# Patient Record
Sex: Female | Born: 1965 | Race: White | Hispanic: No | Marital: Married | Smoking: Never smoker
Health system: Southern US, Community
[De-identification: ages and names within clinical notes are randomized; demographics above are authoritative.]

## PROBLEM LIST (undated history)

## (undated) DIAGNOSIS — T7840XA Allergy, unspecified, initial encounter: Secondary | ICD-10-CM

## (undated) DIAGNOSIS — S5290XA Unspecified fracture of unspecified forearm, initial encounter for closed fracture: Secondary | ICD-10-CM

## (undated) DIAGNOSIS — Z8619 Personal history of other infectious and parasitic diseases: Secondary | ICD-10-CM

## (undated) HISTORY — DX: Allergy, unspecified, initial encounter: T78.40XA

## (undated) HISTORY — DX: Unspecified fracture of unspecified forearm, initial encounter for closed fracture: S52.90XA

## (undated) HISTORY — DX: Personal history of other infectious and parasitic diseases: Z86.19

---

## 1998-05-17 ENCOUNTER — Inpatient Hospital Stay (HOSPITAL_COMMUNITY): Admission: AD | Admit: 1998-05-17 | Discharge: 1998-05-21 | Payer: Self-pay | Admitting: *Deleted

## 1998-05-22 ENCOUNTER — Encounter (HOSPITAL_COMMUNITY): Admission: RE | Admit: 1998-05-22 | Discharge: 1998-08-20 | Payer: Self-pay | Admitting: *Deleted

## 2000-11-27 ENCOUNTER — Other Ambulatory Visit: Admission: RE | Admit: 2000-11-27 | Discharge: 2000-11-27 | Payer: Self-pay | Admitting: Obstetrics and Gynecology

## 2001-11-18 ENCOUNTER — Other Ambulatory Visit: Admission: RE | Admit: 2001-11-18 | Discharge: 2001-11-18 | Payer: Self-pay | Admitting: Obstetrics and Gynecology

## 2002-06-16 ENCOUNTER — Inpatient Hospital Stay (HOSPITAL_COMMUNITY): Admission: AD | Admit: 2002-06-16 | Discharge: 2002-06-18 | Payer: Self-pay | Admitting: Obstetrics and Gynecology

## 2002-08-11 ENCOUNTER — Other Ambulatory Visit: Admission: RE | Admit: 2002-08-11 | Discharge: 2002-08-11 | Payer: Self-pay | Admitting: Obstetrics and Gynecology

## 2015-08-26 ENCOUNTER — Encounter (HOSPITAL_COMMUNITY): Payer: Self-pay | Admitting: Emergency Medicine

## 2015-08-26 ENCOUNTER — Emergency Department (HOSPITAL_COMMUNITY)
Admission: EM | Admit: 2015-08-26 | Discharge: 2015-08-26 | Disposition: A | Discharge status: Home / Self Care | Payer: BLUE CROSS/BLUE SHIELD | Attending: Emergency Medicine | Admitting: Emergency Medicine

## 2015-08-26 ENCOUNTER — Emergency Department (HOSPITAL_COMMUNITY): Payer: BLUE CROSS/BLUE SHIELD

## 2015-08-26 DIAGNOSIS — S6991XA Unspecified injury of right wrist, hand and finger(s), initial encounter: Secondary | ICD-10-CM | POA: Diagnosis present

## 2015-08-26 DIAGNOSIS — W268XXA Contact with other sharp object(s), not elsewhere classified, initial encounter: Secondary | ICD-10-CM | POA: Insufficient documentation

## 2015-08-26 DIAGNOSIS — S61219A Laceration without foreign body of unspecified finger without damage to nail, initial encounter: Secondary | ICD-10-CM

## 2015-08-26 DIAGNOSIS — S61210A Laceration without foreign body of right index finger without damage to nail, initial encounter: Secondary | ICD-10-CM | POA: Insufficient documentation

## 2015-08-26 DIAGNOSIS — Y9389 Activity, other specified: Secondary | ICD-10-CM | POA: Diagnosis not present

## 2015-08-26 DIAGNOSIS — Y999 Unspecified external cause status: Secondary | ICD-10-CM | POA: Insufficient documentation

## 2015-08-26 DIAGNOSIS — Y929 Unspecified place or not applicable: Secondary | ICD-10-CM | POA: Insufficient documentation

## 2015-08-26 NOTE — ED Notes (Signed)
Dermabond given to PA. 

## 2015-08-26 NOTE — ED Triage Notes (Signed)
Pt states she cut her right index finger while washing a hand blender  Pt states she thought it was unplugged but it was not  Pt has a laceration noted to the tip of the finger  Bleeding not controlled upon arrival

## 2015-08-26 NOTE — ED Notes (Signed)
Bulky dressing changed at this time. Pt assisted to elevate finger.

## 2015-08-26 NOTE — ED Provider Notes (Signed)
[facility]-EMERGENCY DEPT Provider Note   CSN: [id number] Arrival date & time: 08/26/15  2145  First Provider Contact:  None       History   Chief Complaint Chief Complaint  Patient presents with  . Laceration    HPI Sheila Odonnell is a 50 y.o. female with no significant past medical history who presents to the emergency department today complaining of a laceration to her right index finger. Patient states that she was cleaning her blender and felt that it was unplugged. However, when she turned it on to wash it it sliced the tip of her right finger. She was unable to control the bleeding. She did wash with peroxide and water. She had her last tetanus shot a few months ago.   HPI  History reviewed. No pertinent past medical history.  There are no active problems to display for this patient.   Past Surgical History:  Procedure Laterality Date  . CESAREAN SECTION      OB History    No data available       Home Medications    Prior to Admission medications   Not on File    Family History Family History  Problem Relation Age of Onset  . Hypertension Father     Social History Social History  Substance Use Topics  . Smoking status: Never Smoker  . Smokeless tobacco: Never Used  . Alcohol use Yes     Comment: social      Allergies   Amoxicillin   Review of Systems Review of Systems  All other systems reviewed and are negative.    Physical Exam Updated Vital Signs BP 140/84 (BP Location: Left Arm)   Pulse 70   Temp 98.4 F (36.9 C) (Oral)   Resp 15   Ht 5\' 2"  (1.575 m)   Wt 70.3 kg   SpO2 96%   BMI 28.35 kg/m   Physical Exam  Constitutional: She is oriented to person, place, and time. She appears well-developed and well-nourished. No distress.  HENT:  Head: Normocephalic and atraumatic.  Eyes: Conjunctivae are normal. Right eye exhibits no discharge. Left eye exhibits no discharge. No scleral icterus.  Cardiovascular: Normal rate.     Pulmonary/Chest: Effort normal.  Musculoskeletal:  FROM of digits. No evidence of tendon injury  Neurological: She is alert and oriented to person, place, and time. Coordination normal.  Normal sensation  Skin: Skin is warm and dry. No rash noted. She is not diaphoretic. No erythema. No pallor.  0.5 cm laceration to distal tip of right index finger that extends into distal aspect of nail. No nail bed disruption. Bleeding controlled. No FB seen or palpated.  Psychiatric: She has a normal mood and affect. Her behavior is normal.  Nursing note and vitals reviewed.    ED Treatments / Results  Labs (all labs ordered are listed, but only abnormal results are displayed) Labs Reviewed - No data to display  EKG  EKG Interpretation None       Radiology No results found.  Procedures .Marland KitchenLaceration Repair Date/Time: 08/26/2015 11:30 PM Performed by: Gaylyn Rong TRIPP Authorized by: Gaylyn Rong TRIPP   Consent:    Consent obtained:  Verbal   Consent given by:  Patient   Risks discussed:  Infection and retained foreign body   Alternatives discussed:  No treatment Anesthesia (see MAR for exact dosages):    Anesthesia method:  None Laceration details:    Location:  Finger   Finger location:  R index  finger   Length (cm):  0.5 Repair type:    Repair type:  Simple Pre-procedure details:    Preparation:  Imaging obtained to evaluate for foreign bodies Exploration:    Hemostasis achieved with:  Direct pressure   Wound exploration: entire depth of wound probed and visualized     Contaminated: no   Treatment:    Area cleansed with:  Betadine   Amount of cleaning:  Standard   Irrigation solution:  Sterile water   Irrigation method:  Syringe   Visualized foreign bodies/material removed: no   Skin repair:    Repair method:  Tissue adhesive Approximation:    Approximation:  Close Post-procedure details:    Dressing:  Non-adherent dressing   Patient tolerance of  procedure:  Tolerated well, no immediate complications   (including critical care time)  Medications Ordered in ED Medications - No data to display   Initial Impression / Assessment and Plan / ED Course  I have reviewed the triage vital signs and the nursing notes.  Pertinent labs & imaging results that were available during my care of the patient were reviewed by me and considered in my medical decision making (see chart for details).  Clinical Course    Tdap UTD. Pressure irrigation performed. Laceration occurred < 8 hours prior to repair which was well tolerated. Laceration repaired with dermabondPt has no co morbidities to effect normal wound healing. Discussed wound home care w pt and answered questions. Pt to f-u for wound check in 7 days. Pt is hemodynamically stable w no complaints prior to dc.     Final Clinical Impressions(s) / ED Diagnoses   Final diagnoses:  Laceration of finger, initial encounter    New Prescriptions New Prescriptions   No medications on file     Dub Mikes, PA-C 08/26/15 2333    Arby Barrette, MD 09/02/15 2332

## 2015-08-26 NOTE — ED Notes (Signed)
PA at bedside.

## 2015-08-26 NOTE — ED Notes (Signed)
PA back to bedside.

## 2015-08-26 NOTE — ED Notes (Signed)
Pt given discharge instructions, verbalized understanding of need to follow up, reasons to return to the ED and medications to take at home for continued pain. Pt denied further questions or concerns. Pt ambulated to exit without difficulty.

## 2015-08-26 NOTE — Discharge Instructions (Signed)
You may wash wound in 24 hours with antibacterial soap and water. Otherwise, keep clean and dry. Follow up with your primary care provider as needed for a wound re-check. Return to the ED if you experience severe worsening of your symptoms, redness or swelling around your wound, fevers or chills.

## 2016-03-28 ENCOUNTER — Encounter: Payer: Self-pay | Admitting: Physician Assistant

## 2016-03-28 ENCOUNTER — Ambulatory Visit (INDEPENDENT_AMBULATORY_CARE_PROVIDER_SITE_OTHER): Payer: BLUE CROSS/BLUE SHIELD | Admitting: Physician Assistant

## 2016-03-28 VITALS — BP 122/80 | HR 68 | Temp 98.7°F | Resp 14 | Ht 62.5 in | Wt 175.0 lb

## 2016-03-28 DIAGNOSIS — K130 Diseases of lips: Secondary | ICD-10-CM | POA: Diagnosis not present

## 2016-03-28 DIAGNOSIS — R5383 Other fatigue: Secondary | ICD-10-CM | POA: Diagnosis not present

## 2016-03-28 LAB — CBC
HCT: 44.4 % (ref 36.0–46.0)
HEMOGLOBIN: 14.7 g/dL (ref 12.0–15.0)
MCHC: 33.2 g/dL (ref 30.0–36.0)
MCV: 87.5 fl (ref 78.0–100.0)
Platelets: 225 10*3/uL (ref 150.0–400.0)
RBC: 5.07 Mil/uL (ref 3.87–5.11)
RDW: 13.2 % (ref 11.5–15.5)
WBC: 5.9 10*3/uL (ref 4.0–10.5)

## 2016-03-28 LAB — TSH: TSH: 1.24 u[IU]/mL (ref 0.35–4.50)

## 2016-03-28 LAB — VITAMIN B12: Vitamin B-12: 404 pg/mL (ref 211–911)

## 2016-03-28 MED ORDER — MUPIROCIN CALCIUM 2 % EX CREA: 1.0000 "application " | TOPICAL_CREAM | Freq: Two times a day (BID) | CUTANEOUS | 0 refills | Status: DC

## 2016-03-28 NOTE — Patient Instructions (Signed)
Please go to the lab for blood work. I will call you with your results.  Please avoid cosmetics to the affected area on the lips. Apply the Mupriocin to the area as directed as this sometimes can be caused by staph. If not improving and labs are good, will need dermatology assessment.  Stay hydrated and eat a well-balanced diet. Please schedule an appointment for complete physical. You will be contacted to schedule a colonoscopy.

## 2016-03-28 NOTE — Progress Notes (Signed)
Patient presents to clinic today to establish care.  Acute Concerns: Intermittent episodes of significant fatigue -- where she will have to lay down. Episodes are occurring about once every 2 weeks x 2 months. Denies personal or family history of hypothyroidism. Had recent labs with Gynecology and was told thyroid function was assessed. Denies depressed mood or anxiety. Some increased stressors recently.  Endorses sleep is somewhat decreased but still averaging 7 hours of sleep per night. Does note dryness of skin in the folds between her lips. Endorses mild  Endorses dog recently diagnosed with Lyme disease. Does note substantial history of tick bites. Non-recently.   Health Maintenance: Immunizations --  Flu shot and Tetanus up-to-date. Colonoscopy -- Overdue. Agrees to referral.  Mammogram -- 1/18. Normal per patient. Will get records. PAP -- Followed by GYN. Last PAP 01/2016. Normal per patient.   Past Medical History:  Diagnosis Date  . Allergy    Seasonal  . History of chickenpox     Past Surgical History:  Procedure Laterality Date  . CESAREAN SECTION     x2 2000, 2004    No current outpatient prescriptions on file prior to visit.   No current facility-administered medications on file prior to visit.     Allergies  Allergen Reactions  . Amoxicillin Rash    Family History  Problem Relation Age of Onset  . Hypertension Father   . Hyperlipidemia Father   . Hypertension Mother     Social History   Social History  . Marital status: Married    Spouse name: Richelle Glick  . Number of children: 2  . Years of education: 4   Occupational History  . Pharmacist    Social History Main Topics  . Smoking status: Never Smoker  . Smokeless tobacco: Never Used  . Alcohol use Yes     Comment: social   . Drug use: No  . Sexual activity: Yes    Birth control/ protection: IUD     Comment: Mirena   Other Topics Concern  . Not on file   Social History Narrative  .  No narrative on file   Review of Systems  Constitutional: Positive for malaise/fatigue. Negative for fever.  Respiratory: Negative for cough and sputum production.   Cardiovascular: Negative for chest pain and palpitations.  Gastrointestinal: Negative for abdominal pain, constipation, diarrhea, heartburn, nausea and vomiting.  Genitourinary: Negative for dysuria, flank pain, frequency, hematuria and urgency.  Musculoskeletal: Negative for myalgias.  Skin: Positive for rash. Negative for itching.  Neurological: Negative for dizziness and headaches.  Psychiatric/Behavioral: Negative for depression, hallucinations, substance abuse and suicidal ideas. The patient is not nervous/anxious and does not have insomnia.    BP 122/80   Pulse 68   Temp 98.7 F (37.1 C) (Oral)   Resp 14   Ht 5' 2.5" (1.588 m)   Wt 175 lb (79.4 kg)   SpO2 95%   BMI 31.50 kg/m   Physical Exam  Constitutional: She is oriented to person, place, and time and well-developed, well-nourished, and in no distress.  HENT:  Head: Normocephalic and atraumatic.  Right Ear: External ear normal.  Left Ear: External ear normal.  Mouth/Throat: Oropharynx is clear and moist.    Eyes: Conjunctivae are normal.  Neck: Neck supple.  Cardiovascular: Normal rate, regular rhythm, normal heart sounds and intact distal pulses.   Pulmonary/Chest: Effort normal and breath sounds normal. No respiratory distress. She has no wheezes. She has no rales. She exhibits no tenderness.  Neurological: She is alert and oriented to person, place, and time.  Skin: Skin is warm and dry. No rash noted.  Psychiatric: Affect normal.  Vitals reviewed.  Assessment/Plan: 1. Other fatigue Will obtain lab panel to further assess including Lyme panel due to patient's history and request. Mood does not seem to be related to symptoms. Increase aerobic exercise. Eat a clean diet and continue good hydration. - CBC - TSH - Vitamin D 1,25 dihydroxy - B12 -  Lyme Ab/Western Blot Reflex; Future - Lyme Ab/Western Blot Reflex  2. Angular cheilitis Has tried OTC steroids and antifungals. Will check B12 level today. Start trial of Bactroban ointment to the area in cause staph is causative organism. If not improving, would refer to Dermatology for further management.  - mupirocin cream (BACTROBAN) 2 %; Apply 1 application topically 2 (two) times daily.  Dispense: 15 g; Refill: 0   Piedad ClimesMartin, Dajiah Kooi [place], [place]-C

## 2016-03-28 NOTE — Progress Notes (Signed)
Pre visit review using our clinic review tool, if applicable. No additional management support is needed unless otherwise documented below in the visit note. 

## 2016-03-29 DIAGNOSIS — R5383 Other fatigue: Secondary | ICD-10-CM | POA: Insufficient documentation

## 2016-03-29 DIAGNOSIS — K130 Diseases of lips: Secondary | ICD-10-CM | POA: Insufficient documentation

## 2016-03-29 LAB — LYME AB/WESTERN BLOT REFLEX: B burgdorferi Ab IgG+IgM: <0.9 Index (ref <0.90)

## 2016-03-31 LAB — VITAMIN D 1,25 DIHYDROXY
Vitamin D 1, 25 (OH)2 Total: 29 pg/mL (ref 18–72)
Vitamin D2 1, 25 (OH)2: <8 pg/mL
Vitamin D3 1, 25 (OH)2: 29 pg/mL

## 2016-07-27 ENCOUNTER — Encounter: Payer: Self-pay | Admitting: Family Medicine

## 2016-07-27 ENCOUNTER — Ambulatory Visit (INDEPENDENT_AMBULATORY_CARE_PROVIDER_SITE_OTHER): Payer: BLUE CROSS/BLUE SHIELD | Admitting: Family Medicine

## 2016-07-27 VITALS — BP 130/78 | HR 100 | Temp 98.8°F | Ht 62.5 in | Wt 179.4 lb

## 2016-07-27 DIAGNOSIS — L255 Unspecified contact dermatitis due to plants, except food: Secondary | ICD-10-CM | POA: Diagnosis not present

## 2016-07-27 MED ORDER — TRIAMCINOLONE ACETONIDE 0.1 % EX OINT: 1.0000 "application " | TOPICAL_OINTMENT | Freq: Two times a day (BID) | CUTANEOUS | 0 refills | Status: AC

## 2016-07-27 MED ORDER — PREDNISONE 5 MG (21) PO TBPK: ORAL_TABLET | ORAL | 0 refills | Status: DC

## 2016-07-27 NOTE — Progress Notes (Signed)
Sheila Odonnell is a 51 y.o. female here for an acute visit.  History of Present Illness:   Insurance claims handlerAmber Odonnell, CMA, acting as scribe for Dr. Earlene PlaterWallace.  Rash  This is a new problem. The current episode started in the past 7 days. The problem has been gradually worsening since onset. The affected locations include the left arm, right arm, left lower leg, right lower leg, abdomen, groin and torso. The rash is characterized by redness and itchiness. She was exposed to plant contact. Pertinent negatives include no congestion, facial edema, fatigue, fever, joint pain, rhinorrhea or shortness of breath. Past treatments include oral steroids and topical steroids. The treatment provided mild relief. Her past medical history is significant for allergies.     PMHx, SurgHx, SocialHx, Medications, and Allergies were reviewed in the Visit Navigator and updated as appropriate.  Current Medications:   Current Outpatient Prescriptions:  .  desonide (DESOWEN) 0.05 % ointment, , Disp: , Rfl:  .  fexofenadine (ALLEGRA) 180 MG tablet, Take 180 mg by mouth 2 (two) times daily as needed for allergies or rhinitis., Disp: , Rfl:  .  ibuprofen (ADVIL,MOTRIN) 600 MG tablet, Take 600 mg by mouth every 6 (six) hours as needed., Disp: , Rfl:  .  levonorgestrel (MIRENA, 52 MG,) 20 MCG/24HR IUD, 1 each by Intrauterine route once., Disp: , Rfl:  .  Multiple Vitamin (MULTIVITAMIN) tablet, Take 1 tablet by mouth daily., Disp: , Rfl:    Allergies  Allergen Reactions  . Amoxicillin Rash   Review of Systems:   Review of Systems  Constitutional: Negative for fatigue and fever.  HENT: Negative for congestion and rhinorrhea.   Respiratory: Negative for shortness of breath.   Musculoskeletal: Negative for joint pain.  Skin: Positive for rash.   Vitals:   Vitals:   07/27/16 1522  BP: 130/78  Pulse: 100  Temp: 98.8 F (37.1 C)  TempSrc: Oral  SpO2: 95%  Weight: 179 lb 6.4 oz (81.4 kg)  Height: 5' 2.5" (1.588 m)     Body mass index is 32.29 kg/m.  Physical Exam:   Physical Exam  Constitutional: She appears well-developed and well-nourished. No distress.  HENT:  Head: Normocephalic and atraumatic.  Eyes: EOM are normal. Pupils are equal, round, and reactive to light.  Neck: Normal range of motion. Neck supple.  Cardiovascular: Normal rate, regular rhythm, normal heart sounds and intact distal pulses.   Pulmonary/Chest: Effort normal.  Abdominal: Soft.  Skin: Skin is warm.  Diffuse excoriated dermatitis without signs of infection.  Psychiatric: She has a normal mood and affect. Her behavior is normal.  Nursing note and vitals reviewed.  Assessment and Plan:   Sheila Odonnell was seen today for acute visit.  Diagnoses and all orders for this visit:  Plant dermatitis -     predniSONE (STERAPRED UNI-PAK 21 TAB) 5 MG (21) TBPK tablet; 6, 5, 4, 3, 2, 1, off -     triamcinolone ointment (KENALOG) 0.1 %; Apply 1 application topically 2 (two) times daily.   . Reviewed expectations re: course of current medical issues. . Discussed self-management of symptoms. . Outlined signs and symptoms indicating need for more acute intervention. . Patient verbalized understanding and all questions were answered. Marland Kitchen. Health Maintenance issues including appropriate healthy diet, exercise, and smoking avoidance were discussed with patient. . See orders for this visit as documented in the electronic medical record. . Patient received an After Visit Summary.  CMA served as Neurosurgeonscribe during this visit. History, Physical, and Plan  performed by medical provider. The above documentation has been reviewed and is accurate and complete. Helane Rima, D.O.  Helane Rima, DO , [facility] 07/27/2016

## 2017-06-20 ENCOUNTER — Encounter: Payer: Self-pay | Admitting: Physician Assistant

## 2017-08-27 IMAGING — CR DG HAND COMPLETE 3+V*R*
3 series · 3 of 3 positions shown · non-contrast
Comparison: None.

CLINICAL DATA: Laceration involving the tip of the index finger
following blender injury. Evaluate for fracture.

EXAM:
RIGHT HAND - COMPLETE 3+ VIEW

[x hand pa right]
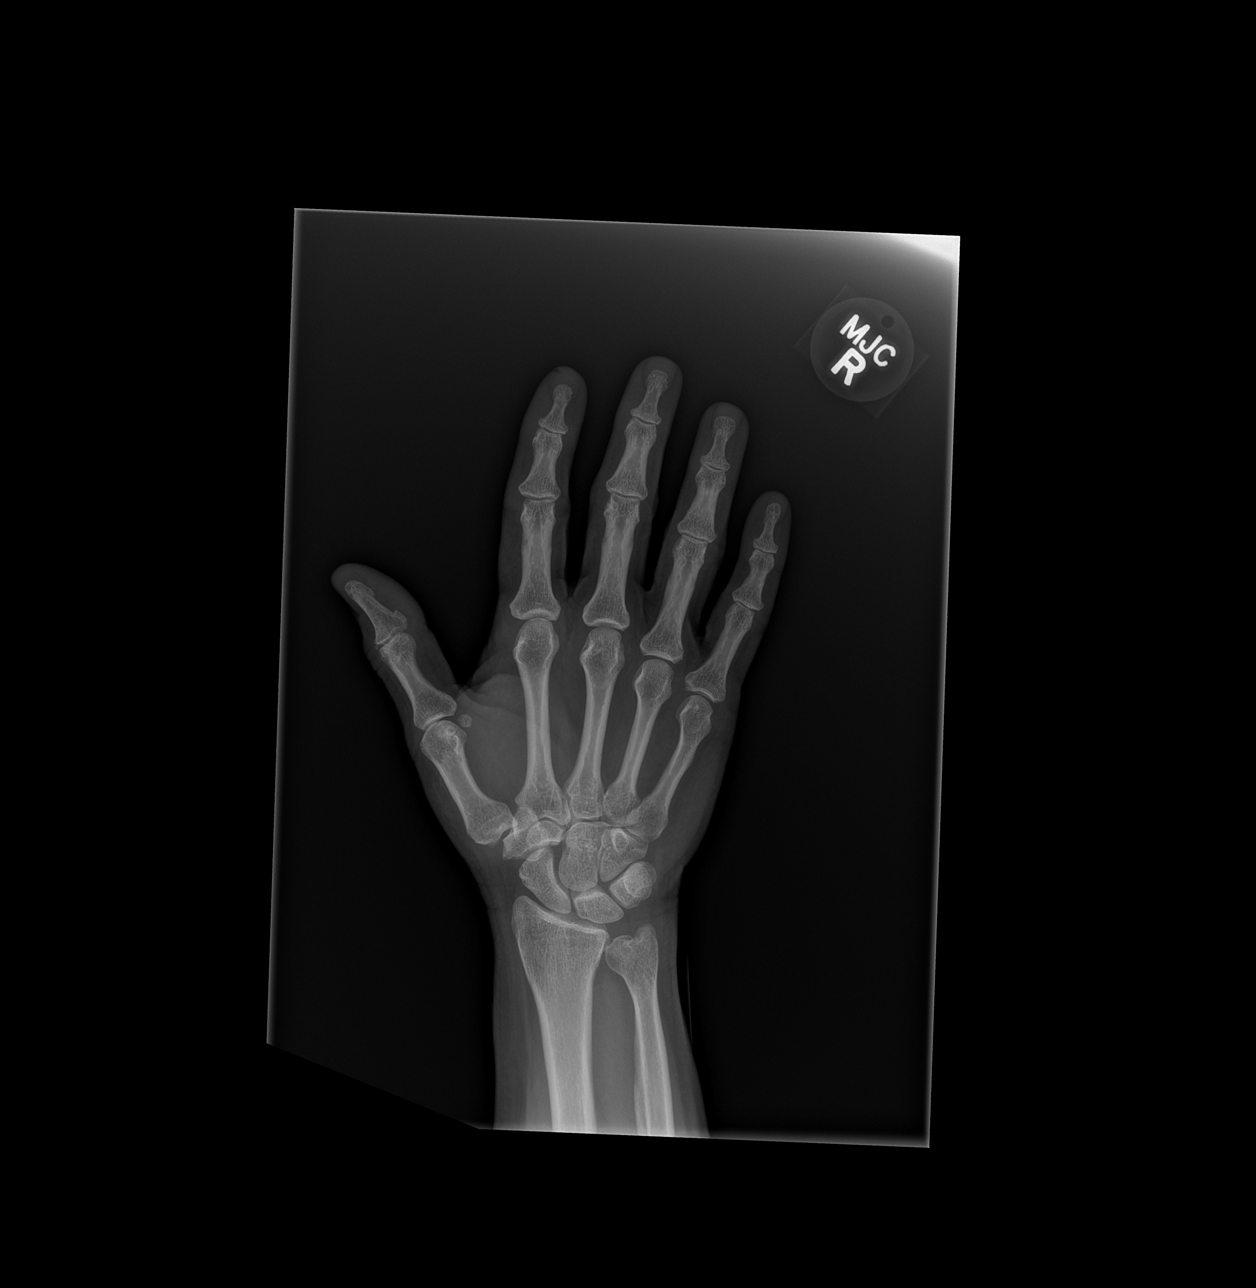

[x hand obl right]
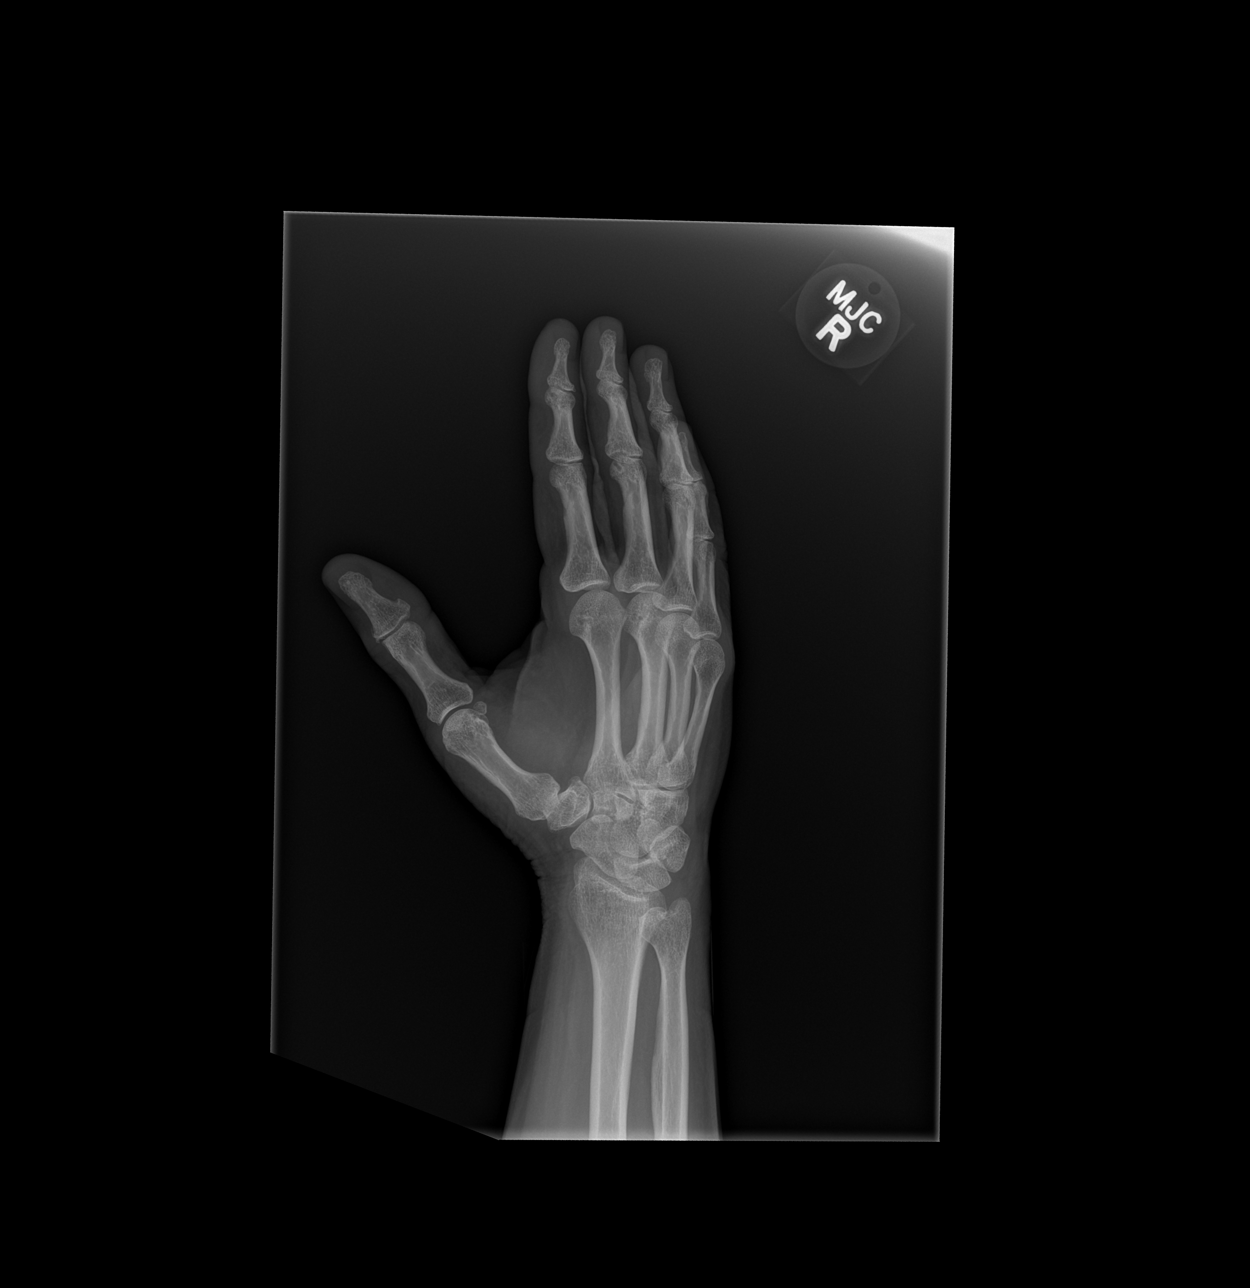

[x hand lat right]
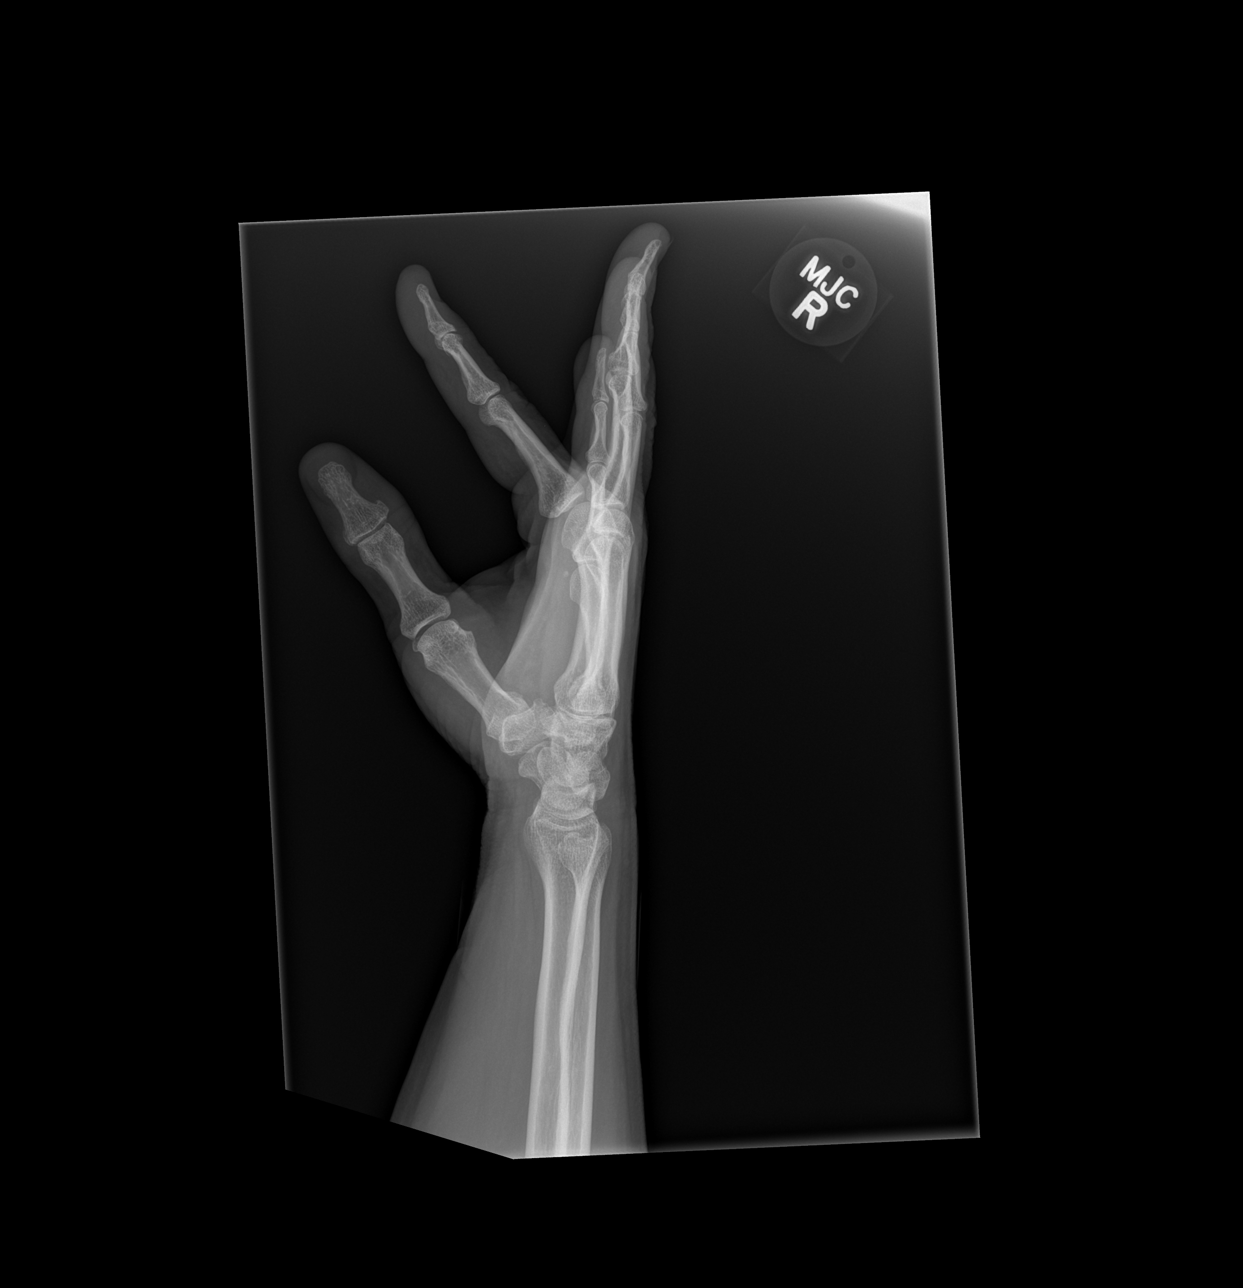

[3 of 3 positions shown; findings below may reference images not displayed]

FINDINGS: There is a tiny laceration involving the soft tissues about the tip
of the index finger. No associated fracture or radiopaque foreign
body. Joint spaces are preserved. No erosions. No evidence of
chondrocalcinosis.
IMPRESSION: Tiny laceration involving the tip of the index finger without
associated fracture or radiopaque foreign body.

## 2017-11-19 ENCOUNTER — Ambulatory Visit: Payer: Self-pay | Admitting: *Deleted

## 2017-11-19 NOTE — Telephone Encounter (Signed)
Pt called with complaints of heart fluttering for the past 3 months; she says that she noticed it when she had a chest cold but this has continued and is occurring more frequently; she says that she would like to be seen by Dr Helane Rima, [facility], but is agreeable to seeing a female provder at [facility]; recommendations made per nurse protocol; pt offered and accepted appointment with Dr Mardelle Matte, [facility], 11/20/17 at 0945; the pt would also like to have an ekg; will route t office for notification.   Reason for Disposition . [1] Palpitations AND [2] no improvement after using CARE ADVICE  Answer Assessment - Initial Assessment Questions 1. DESCRIPTION: "Please describe your heart rate or heart beat that you are having" (e.g., fast/slow, regular/irregular, skipped or extra beats, "palpitations")     fluttering 2. ONSET: "When did it start?" (Minutes, hours or days)      09/19/17 3. DURATION: "How long does it last" (e.g., seconds, minutes, hours)     seconds 4. PATTERN "Does it come and go, or has it been constant since it started?"  "Does it get worse with exertion?"   "Are you feeling it now?"     Intermittent, worsens with coughing; not exercise indused 5. TAP: "Using your hand, can you tap out what you are feeling on a chair or table in front of you, so that I can hear?" (Note: not all patients can do this)       Not able to complete 6. HEART RATE: "Can you tell me your heart rate?" "How many beats in 15 seconds?"  (Note: not all patients can do this)       Not able to complete 7. RECURRENT SYMPTOM: "Have you ever had this before?" If so, ask: "When was the last time?" and "What happened that time?"      11/19/17 8. CAUSE: "What do you think is causing the palpitations?"     unsure 9. CARDIAC HISTORY: "Do you have any history of heart disease?" (e.g., heart attack, angina, bypass surgery, angioplasty, arrhythmia)      no 10. OTHER SYMPTOMS: "Do you have any other  symptoms?" (e.g., dizziness, chest pain, sweating, difficulty breathing)       no 11. PREGNANCY: "Is there any chance you are pregnant?" "When was your last menstrual period?"       No IUD  Protocols used: HEART RATE AND HEARTBEAT QUESTIONS-A-AH

## 2017-11-20 ENCOUNTER — Ambulatory Visit (INDEPENDENT_AMBULATORY_CARE_PROVIDER_SITE_OTHER): Payer: BLUE CROSS/BLUE SHIELD | Admitting: Family Medicine

## 2017-11-20 ENCOUNTER — Other Ambulatory Visit: Payer: Self-pay

## 2017-11-20 ENCOUNTER — Encounter: Payer: Self-pay | Admitting: Family Medicine

## 2017-11-20 VITALS — BP 118/84 | HR 75 | Temp 98.7°F | Ht 62.5 in | Wt 178.4 lb

## 2017-11-20 DIAGNOSIS — Z566 Other physical and mental strain related to work: Secondary | ICD-10-CM

## 2017-11-20 DIAGNOSIS — Z23 Encounter for immunization: Secondary | ICD-10-CM | POA: Diagnosis not present

## 2017-11-20 DIAGNOSIS — R002 Palpitations: Secondary | ICD-10-CM

## 2017-11-20 LAB — BASIC METABOLIC PANEL
BUN: 14 mg/dL (ref 6–23)
CALCIUM: 9.7 mg/dL (ref 8.4–10.5)
CO2: 27 meq/L (ref 19–32)
CREATININE: 0.76 mg/dL (ref 0.40–1.20)
Chloride: 104 mEq/L (ref 96–112)
GFR: 84.7 mL/min (ref >60.00)
GLUCOSE: 105 mg/dL — AB (ref 70–99)
Potassium: 4 mEq/L (ref 3.5–5.1)
Sodium: 137 mEq/L (ref 135–145)

## 2017-11-20 LAB — CBC WITH DIFFERENTIAL/PLATELET
BASOS PCT: 1 % (ref 0.0–3.0)
Basophils Absolute: 0 10*3/uL (ref 0.0–0.1)
Eosinophils Absolute: 0.1 10*3/uL (ref 0.0–0.7)
Eosinophils Relative: 2 % (ref 0.0–5.0)
HEMATOCRIT: 44.4 % (ref 36.0–46.0)
HEMOGLOBIN: 15.1 g/dL — AB (ref 12.0–15.0)
LYMPHS PCT: 31.4 % (ref 12.0–46.0)
Lymphs Abs: 1.5 10*3/uL (ref 0.7–4.0)
MCHC: 33.9 g/dL (ref 30.0–36.0)
MCV: 87.2 fl (ref 78.0–100.0)
MONOS PCT: 6.2 % (ref 3.0–12.0)
Monocytes Absolute: 0.3 10*3/uL (ref 0.1–1.0)
Neutro Abs: 2.9 10*3/uL (ref 1.4–7.7)
Neutrophils Relative %: 59.4 % (ref 43.0–77.0)
Platelets: 215 10*3/uL (ref 150.0–400.0)
RBC: 5.1 Mil/uL (ref 3.87–5.11)
RDW: 13.6 % (ref 11.5–15.5)
WBC: 4.8 10*3/uL (ref 4.0–10.5)

## 2017-11-20 LAB — LIPID PANEL
CHOL/HDL RATIO: 3
Cholesterol: 174 mg/dL (ref 0–200)
HDL: 56 mg/dL (ref >39.00)
LDL Cholesterol: 103 mg/dL — ABNORMAL HIGH (ref 0–99)
NONHDL: 118.36
Triglycerides: 75 mg/dL (ref 0.0–149.0)
VLDL: 15 mg/dL (ref 0.0–40.0)

## 2017-11-20 LAB — TSH: TSH: 1.31 u[IU]/mL (ref 0.35–4.50)

## 2017-11-20 NOTE — Progress Notes (Signed)
Subjective  CC:  Chief Complaint  Patient presents with  . Heart Fluttering    Patient states she has had a heart fluttering x 3 months.     HPI: Sheila Odonnell is a 52 y.o. female who presents to the office today to address the problems listed above in the chief complaint. She presents with a fluttering sensation. The symptoms are mild, occur intermittently, and last several  seconds per episode. They tend to occur while at work. 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 No sxs of panic. No h/o mood disorders. Pulse has been checked during episode: 95 and regular.. Cardiac risk factors include: none. Aggravating factors: caffeine, stress/anxiety, drinks 3 cups of coffee each morning and this is an increase. Relieving factors: spontaneous. Associated symptoms: none. Patient denies: abdominal pain, chest pain, cough, dizziness, fatigue, leg swelling, palpitations, rapid heart beat, shortness of breath, slow heart beat and or skin changes/edema or hot intolerance.  Assessment  1. Palpitations   2. Stress at work      Plan   Heart fluttering:  Most consistent with benign palpitations, likely stress and caffeine related. Check labs to ensure no other organic cause. Work on [employer], see avs; return for further evaluation if persists or worsens.   Flu shot today  Consider appt for CPE with PCP   Follow up: prn   Orders Placed This Encounter  Procedures  . TSH  . CBC with Differential/Platelet  . Basic metabolic panel  . EKG 12-Lead   No orders of the defined types were placed in this encounter.     I reviewed the patients updated PMH, FH, and SocHx.    Patient Active Problem List   Diagnosis Date Noted  . Other fatigue 03/29/2016  . Angular cheilitis 03/29/2016   Current Meds  Medication Sig  . levonorgestrel (MIRENA, 52 MG,) 20 MCG/24HR IUD 1 each by Intrauterine route once.      Allergies: Patient is allergic to amoxicillin. Family History: Patient family history includes Healthy in her brother; Hyperlipidemia in her father; Hypertension in her mother; Hypertension (age of onset: 86) in her father; Renal cancer (age of onset: 42) in her paternal grandfather. Social History:  Patient  reports that she has never smoked. She has never used smokeless tobacco. She reports that she drinks alcohol. She reports that she does not use drugs.  Review of Systems: Constitutional: Negative for fever malaise or anorexia Cardiovascular: negative for chest pain Respiratory: negative for SOB or persistent cough Gastrointestinal: negative for abdominal pain  Objective  Vitals: BP 118/84   Pulse 75   Temp 98.7 F (37.1 C)   Ht 5' 2.5" (1.588 m)   Wt 178 lb 6.4 oz (80.9 kg)   SpO2 98%   BMI 32.11 kg/m  General: no acute distress , A&Ox3 HEENT: PEERL, conjunctiva normal, Oropharynx moist,neck is supple, no thyromegaly Cardiovascular:  RRR without murmur or gallop.  Respiratory:  Good breath sounds bilaterally, CTAB with normal respiratory effort Skin:  Warm, no rashes Ext: no edema Neuro: very fine tremor  EKG: NSR, nl EKG   Commons side effects, risks, benefits, and alternatives for medications and treatment plan prescribed today were discussed, and the patient expressed understanding of the given instructions. Patient is instructed to call or message via MyChart if he/she has any questions or concerns regarding our treatment plan. No barriers to understanding were identified. We discussed Red  Flag symptoms and signs in detail. Patient expressed understanding regarding what to do in case of urgent or emergency type symptoms.   Medication list was reconciled, printed and provided to the patient in AVS. Patient instructions and summary information was reviewed with the patient as documented in the AVS. This note was prepared with assistance of Dragon voice recognition  software. Occasional wrong-word or sound-a-like substitutions may have occurred due to the inherent limitations of voice recognition software

## 2017-11-20 NOTE — Patient Instructions (Addendum)
Please decrease your caffeine intake and work on stress reduction and relaxation exercises.  I will release your lab results to you on your MyChart account with further instructions. Please reply with any questions.   If your symptoms persist after changing these things, please return for further evaluation.   And please consider scheduling an annual physical exam with Selena Batten at your convenience.   If you have any questions or concerns, please don't hesitate to send me a message via MyChart or call the office at [phone]. Thank you for visiting with [place] today! It's our pleasure caring for you.   Palpitations A palpitation is the feeling that your heartbeat is irregular or is faster than normal. It may feel like your heart is fluttering or skipping a beat. Palpitations are usually not a serious problem. They may be caused by many things, including smoking, caffeine, alcohol, stress, and certain medicines. Although most causes of palpitations are not serious, palpitations can be a sign of a serious medical problem. In some cases, you may need further medical evaluation. Follow these instructions at home: Pay attention to any changes in your symptoms. Take these actions to help with your condition:  Avoid the following: ? Caffeinated coffee, tea, soft drinks, diet pills, and energy drinks. ? Chocolate. ? Alcohol.  Do not use any tobacco products, such as cigarettes, chewing tobacco, and e-cigarettes. If you need help quitting, ask your health care provider.  Try to reduce your stress and anxiety. Things that can help you relax include: ? Yoga. ? Meditation. ? Physical activity, such as swimming, jogging, or walking. ? Biofeedback. This is a method that helps you learn to use your mind to control things in your body, such as your heartbeats.  Get plenty of rest and sleep.  Take over-the-counter and prescription medicines only as told by your health care provider.  Keep all follow-up  visits as told by your health care provider. This is important.  Contact a health care provider if:  You continue to have a fast or irregular heartbeat after 24 hours.  Your palpitations occur more often. Get help right away if:  You have chest pain or shortness of breath.  You have a severe headache.  You feel dizzy or you faint. This information is not intended to replace advice given to you by your health care provider. Make sure you discuss any questions you have with your health care provider. Document Released: 01/14/2000 Document Revised: 06/21/2015 Document Reviewed: 10/01/2014 Elsevier Interactive Patient Education  [employer].

## 2017-11-20 NOTE — Progress Notes (Signed)
Normal EKG

## 2018-01-11 ENCOUNTER — Encounter: Payer: Self-pay | Admitting: Physician Assistant

## 2018-05-09 ENCOUNTER — Ambulatory Visit (INDEPENDENT_AMBULATORY_CARE_PROVIDER_SITE_OTHER): Payer: Self-pay | Admitting: Family Medicine

## 2018-05-09 ENCOUNTER — Encounter: Payer: Self-pay | Admitting: Family Medicine

## 2018-05-09 ENCOUNTER — Other Ambulatory Visit: Payer: Self-pay

## 2018-05-09 DIAGNOSIS — W57XXXA Bitten or stung by nonvenomous insect and other nonvenomous arthropods, initial encounter: Secondary | ICD-10-CM

## 2018-05-09 DIAGNOSIS — B029 Zoster without complications: Secondary | ICD-10-CM

## 2018-05-09 DIAGNOSIS — S70262A Insect bite (nonvenomous), left hip, initial encounter: Secondary | ICD-10-CM

## 2018-05-09 MED ORDER — DOXYCYCLINE HYCLATE 100 MG PO TABS: 100.0000 mg | ORAL_TABLET | Freq: Two times a day (BID) | ORAL | 0 refills | Status: AC

## 2018-05-09 MED ORDER — VALACYCLOVIR HCL 1 G PO TABS: 1000.0000 mg | ORAL_TABLET | Freq: Three times a day (TID) | ORAL | 0 refills | Status: AC

## 2018-05-09 NOTE — Progress Notes (Signed)
   Virtual Visit via Video   I connected with Sheila Odonnell on 05/09/18 at  2:00 PM EDT by a video enabled telemedicine application and verified that I am speaking with the correct person using two identifiers. Location patient: Home Location provider: [facility], Office Persons participating in the virtual visit: pt and myself  I discussed the limitations of evaluation and management by telemedicine and the availability of in person appointments. The patient expressed understanding and agreed to proceed.  Subjective:   HPI:  Tick bite- bite occurred on Sunday.  Tick was 'pulled off immediately'.  Some itching.  Yesterday noticed that redness was spreading from L hip into groin.  Also developed muscle aches and stiffness radiating into groin and back.  Took 2 benadryl last night and 2 ibuprofen this AM.  No fevers.  Redness is better today.  Last night was applying triamcinolone cream which improved the redness.  No diffuse muscle aches, just localized.  No rash present.  Pt feels the redness was 'more of a severe allergic rxn'.    ROS: See pertinent positives and negatives per HPI.  Patient Active Problem List   Diagnosis Date Noted  . Other fatigue 03/29/2016  . Angular cheilitis 03/29/2016    Social History   Tobacco Use  . Smoking status: Never Smoker  . Smokeless tobacco: Never Used  Substance Use Topics  . Alcohol use: Yes    Comment: social     Current Outpatient Medications:  .  fexofenadine (ALLEGRA) 180 MG tablet, Take 180 mg by mouth 2 (two) times daily as needed for allergies or rhinitis., Disp: , Rfl:  .  ibuprofen (ADVIL,MOTRIN) 600 MG tablet, Take 600 mg by mouth every 6 (six) hours as needed., Disp: , Rfl:  .  levonorgestrel (MIRENA, 52 MG,) 20 MCG/24HR IUD, 1 each by Intrauterine route once., Disp: , Rfl:  .  Multiple Vitamin (MULTIVITAMIN) tablet, Take 1 tablet by mouth daily., Disp: , Rfl:  .  triamcinolone ointment (KENALOG) 0.1 %, Apply 1  application topically 2 (two) times daily., Disp: 453.6 g, Rfl: 0  Allergies  Allergen Reactions  . Amoxicillin Rash    Objective:   There were no vitals taken for this visit. AAOx3, NAD NCAT, EOMI No obvious CN deficits Coloring WNL L hip w/ dark bite site surrounded by erythematous almost vesicular looking lesions in a linear pattern Pt is able to speak clearly, coherently without shortness of breath or increased work of breathing.  Thought process is linear.  Mood is appropriate.   Assessment and Plan:   Tick bite- new.  Pt removed tick completely but then had spreading redness and myalgias after the fact.  No fevers or bullseye type rash.  However, given the spreading redness, I am concerned for infxn.  Start Doxy given pt's Amox allergy as this will cover infxn and any tick borne illnesses.  Shingles- pt was able to show me the bite site and while there is clearly a bite site, it appears to be surrounded by shingles.  Pt reports the area is very itchy but not painful or sensitive to touch.  Had 1st Shingrix but not the 2nd.  Will send script for Valtrex in case rash expands in dermatomal pattern, declaring itself as shingles.  Pt expressed understanding and is in agreement w/ plan.    Neena Rhymes, MD 05/09/2018

## 2018-06-26 ENCOUNTER — Encounter: Payer: Self-pay | Admitting: Emergency Medicine

## 2018-07-10 ENCOUNTER — Encounter: Payer: Self-pay | Admitting: Emergency Medicine

## 2018-10-18 ENCOUNTER — Other Ambulatory Visit: Payer: Self-pay

## 2018-10-18 DIAGNOSIS — Z20822 Contact with and (suspected) exposure to covid-19: Secondary | ICD-10-CM

## 2018-10-19 LAB — NOVEL CORONAVIRUS, NAA: SARS-CoV-2, NAA: NOT DETECTED

## 2019-02-20 ENCOUNTER — Telehealth: Payer: Self-pay | Admitting: Physician Assistant

## 2019-02-20 NOTE — Telephone Encounter (Signed)
If patient is running a fever then she would not need to go to work. Please advise in PCP absence.

## 2019-02-20 NOTE — Telephone Encounter (Signed)
No work until she is minimum 24 hrs fever free

## 2019-02-20 NOTE — Telephone Encounter (Signed)
Left a detailed message on patient VM of not returning to work if having a fever. She has to be fever free for 24 hours without any medication Recommend rest, increasing fluids, Tylenol for aches and fever.

## 2019-02-20 NOTE — Telephone Encounter (Signed)
Pt called in stating that she got the 1st covid shot yesterday at 930am, she now fever, headaches, chills, nausea, and just feels bad, she wanted to know what she should do about this and if she is ok to go to work tomorrow. Please advise

## 2019-02-20 NOTE — Telephone Encounter (Signed)
Spoke with pt she is aware.  

## 2019-03-03 ENCOUNTER — Ambulatory Visit: Payer: Self-pay | Attending: Internal Medicine

## 2019-03-03 DIAGNOSIS — Z20822 Contact with and (suspected) exposure to covid-19: Secondary | ICD-10-CM

## 2019-03-04 LAB — NOVEL CORONAVIRUS, NAA: SARS-CoV-2, NAA: NOT DETECTED
